# Patient Record
Sex: Female | Born: 1960 | Race: White | Hispanic: No | Marital: Single | Smoking: Current every day smoker
Health system: Southern US, Community
[De-identification: ages and names within clinical notes are randomized; demographics above are authoritative.]

## PROBLEM LIST (undated history)

## (undated) DIAGNOSIS — N289 Disorder of kidney and ureter, unspecified: Secondary | ICD-10-CM

## (undated) DIAGNOSIS — R011 Cardiac murmur, unspecified: Secondary | ICD-10-CM

## (undated) HISTORY — PX: JOINT REPLACEMENT: SHX530

---

## 2016-10-15 ENCOUNTER — Emergency Department (HOSPITAL_COMMUNITY)
Admission: EM | Admit: 2016-10-15 | Discharge: 2016-10-15 | Disposition: A | Discharge status: Home / Self Care | Payer: Medicaid - Out of State | Attending: Emergency Medicine | Admitting: Emergency Medicine

## 2016-10-15 ENCOUNTER — Emergency Department (HOSPITAL_COMMUNITY): Payer: Medicaid - Out of State

## 2016-10-15 ENCOUNTER — Encounter (HOSPITAL_COMMUNITY): Payer: Self-pay | Admitting: Emergency Medicine

## 2016-10-15 DIAGNOSIS — R112 Nausea with vomiting, unspecified: Secondary | ICD-10-CM | POA: Diagnosis not present

## 2016-10-15 DIAGNOSIS — R1013 Epigastric pain: Secondary | ICD-10-CM

## 2016-10-15 DIAGNOSIS — F17210 Nicotine dependence, cigarettes, uncomplicated: Secondary | ICD-10-CM | POA: Insufficient documentation

## 2016-10-15 DIAGNOSIS — K5900 Constipation, unspecified: Secondary | ICD-10-CM | POA: Diagnosis not present

## 2016-10-15 HISTORY — DX: Disorder of kidney and ureter, unspecified: N28.9

## 2016-10-15 LAB — URINALYSIS, ROUTINE W REFLEX MICROSCOPIC
Bacteria, UA: NONE SEEN
Bilirubin Urine: NEGATIVE
GLUCOSE, UA: NEGATIVE mg/dL
HGB URINE DIPSTICK: NEGATIVE
Ketones, ur: 20 mg/dL — AB
LEUKOCYTES UA: NEGATIVE
NITRITE: NEGATIVE
PH: 9 — AB (ref 5.0–8.0)
PROTEIN: 30 mg/dL — AB
SPECIFIC GRAVITY, URINE: 1.017 (ref 1.005–1.030)
WBC, UA: NONE SEEN WBC/hpf (ref 0–5)

## 2016-10-15 LAB — COMPREHENSIVE METABOLIC PANEL
ALBUMIN: 4.5 g/dL (ref 3.5–5.0)
ALK PHOS: 100 U/L (ref 38–126)
ALT: 21 U/L (ref 14–54)
AST: 20 U/L (ref 15–41)
Anion gap: 6 (ref 5–15)
BILIRUBIN TOTAL: 0.6 mg/dL (ref 0.3–1.2)
BUN: 16 mg/dL (ref 6–20)
CALCIUM: 10.6 mg/dL — AB (ref 8.9–10.3)
CO2: 27 mmol/L (ref 22–32)
Chloride: 105 mmol/L (ref 101–111)
Creatinine, Ser: 0.5 mg/dL (ref 0.44–1.00)
GFR calc Af Amer: >60 mL/min (ref >60)
GLUCOSE: 119 mg/dL — AB (ref 65–99)
POTASSIUM: 3.6 mmol/L (ref 3.5–5.1)
Sodium: 138 mmol/L (ref 135–145)
TOTAL PROTEIN: 7.1 g/dL (ref 6.5–8.1)

## 2016-10-15 LAB — CBC
HCT: 41.3 % (ref 36.0–46.0)
Hemoglobin: 14.1 g/dL (ref 12.0–15.0)
MCH: 27.4 pg (ref 26.0–34.0)
MCHC: 34.1 g/dL (ref 30.0–36.0)
MCV: 80.2 fL (ref 78.0–100.0)
PLATELETS: 289 10*3/uL (ref 150–400)
RBC: 5.15 MIL/uL — ABNORMAL HIGH (ref 3.87–5.11)
RDW: 13.6 % (ref 11.5–15.5)
WBC: 7.6 10*3/uL (ref 4.0–10.5)

## 2016-10-15 LAB — LIPASE, BLOOD: Lipase: 13 U/L (ref 11–51)

## 2016-10-15 MED ORDER — IOPAMIDOL (ISOVUE-300) INJECTION 61%
INTRAVENOUS | Status: AC
  Administered 2016-10-15: 100 mL via INTRAVENOUS
  Filled 2016-10-15: qty 100

## 2016-10-15 MED ORDER — FENTANYL CITRATE (PF) 100 MCG/2ML IJ SOLN
25.0000 ug | Freq: Once | INTRAMUSCULAR | Status: AC
  Administered 2016-10-15: 25 ug via INTRAVENOUS
  Filled 2016-10-15: qty 2

## 2016-10-15 MED ORDER — MORPHINE SULFATE (PF) 4 MG/ML IV SOLN
4.0000 mg | Freq: Once | INTRAVENOUS | Status: AC
  Administered 2016-10-15: 4 mg via INTRAVENOUS
  Filled 2016-10-15: qty 1

## 2016-10-15 MED ORDER — ONDANSETRON 8 MG PO TBDP: 8.0000 mg | ORAL_TABLET | Freq: Three times a day (TID) | ORAL | 0 refills | Status: AC | PRN

## 2016-10-15 MED ORDER — OMEPRAZOLE 20 MG PO CPDR
20.0000 mg | DELAYED_RELEASE_CAPSULE | Freq: Every day | ORAL | 0 refills | Status: AC
Start: 2016-10-15 — End: ?

## 2016-10-15 MED ORDER — SODIUM CHLORIDE 0.9 % IV BOLUS (SEPSIS)
1000.0000 mL | Freq: Once | INTRAVENOUS | Status: AC
  Administered 2016-10-15: 1000 mL via INTRAVENOUS

## 2016-10-15 MED ORDER — GI COCKTAIL ~~LOC~~
30.0000 mL | Freq: Once | ORAL | Status: AC
  Administered 2016-10-15: 30 mL via ORAL
  Filled 2016-10-15: qty 30

## 2016-10-15 MED ORDER — DICYCLOMINE HCL 20 MG PO TABS: 20.0000 mg | ORAL_TABLET | Freq: Two times a day (BID) | ORAL | 0 refills | Status: AC

## 2016-10-15 MED ORDER — MORPHINE SULFATE (PF) 2 MG/ML IV SOLN: 2.0000 mg | Freq: Once | INTRAVENOUS | Status: DC

## 2016-10-15 MED ORDER — METOCLOPRAMIDE HCL 5 MG/ML IJ SOLN
10.0000 mg | Freq: Once | INTRAMUSCULAR | Status: AC
  Administered 2016-10-15: 10 mg via INTRAVENOUS
  Filled 2016-10-15: qty 2

## 2016-10-15 NOTE — ED Provider Notes (Signed)
[facility] Provider Note   CSN: [id number] Arrival date & time: 10/15/16  1400     History   Chief Complaint Chief Complaint  Patient presents with  . Abdominal Pain    HPI Laura Stevenson is a 56 y.o. female.  HPI 56 year old Hispanic female with no significant past medical history presents to the emergency department today with complaints of epigastric abdominal pain. Patient states that she woke up this morning with severe epigastric abdominal pain. States the pain radiates to her left kidney. Had history of same in the past that was likely due to gastroenteritis. States that she does have a history of a cyst on her left kidney that she has not followed up with. She reports associated nausea and one episode of nonbloody nonbilious emesis. States that she did have a bowel movement this morning that was hard in nature. Reports constipation. States that her pain became worse after drinking water this morning. She has not tried nothing for her symptoms at home. Nothing makes better or worse. Rates the pain as sharp in nature and intermittent comes in waves. Rates her pain a 10 out of 10.  Pt denies any fever, chill, ha, vision changes, lightheadedness, dizziness, congestion, neck pain, cp, sob, cough, diarrhea, urinary symptoms, change in bowel habits, melena, hematochezia, lower extremity paresthesias.  Past Medical History:  Diagnosis Date  . Renal disorder     There are no active problems to display for this patient.   History reviewed. No pertinent surgical history.  OB History    No data available       Home Medications    Prior to Admission medications   Not on File    Family History No family history on file.  Social History Social History  Substance Use Topics  . Smoking status: Current Every Day Smoker    Packs/day: 0.50    Types: Cigarettes  . Smokeless tobacco: Not on file  . Alcohol use No     Allergies   Patient has no known  allergies.   Review of Systems Review of Systems  Constitutional: Negative for chills and fever.  HENT: Negative for congestion.   Eyes: Negative for visual disturbance.  Respiratory: Negative for cough and shortness of breath.   Cardiovascular: Negative for chest pain.  Gastrointestinal: Positive for abdominal pain, constipation, nausea and vomiting. Negative for blood in stool and diarrhea.  Genitourinary: Negative for dysuria, flank pain, frequency, hematuria, urgency, vaginal bleeding and vaginal discharge.  Musculoskeletal: Negative for arthralgias and myalgias.  Skin: Negative for rash.  Neurological: Negative for dizziness, syncope, weakness, light-headedness, numbness and headaches.  Psychiatric/Behavioral: Negative for sleep disturbance. The patient is not nervous/anxious.      Physical Exam Updated Vital Signs BP 107/67 (BP Location: Left Arm)   Pulse 71   Temp 98.5 F (36.9 C) (Oral)   Resp 14   SpO2 100%   Physical Exam  Constitutional: She is oriented to person, place, and time. She appears well-developed and well-nourished.  Non-toxic appearance. No distress.  HENT:  Head: Normocephalic and atraumatic.  Nose: Nose normal.  Mouth/Throat: Oropharynx is clear and moist.  Eyes: Pupils are equal, round, and reactive to light. Conjunctivae are normal. Right eye exhibits no discharge. Left eye exhibits no discharge.  Neck: Normal range of motion. Neck supple.  Cardiovascular: Normal rate, regular rhythm, normal heart sounds and intact distal pulses.   Pulmonary/Chest: Effort normal and breath sounds normal. No respiratory distress. She exhibits no tenderness.  Abdominal: Soft.  Bowel sounds are normal. She exhibits no pulsatile midline mass. There is tenderness in the epigastric area. There is no rigidity, no rebound, no guarding, no CVA tenderness, no tenderness at McBurney's point and negative Murphy's sign.  Musculoskeletal: Normal range of motion. She exhibits no  tenderness.  Lymphadenopathy:    She has no cervical adenopathy.  Neurological: She is alert and oriented to person, place, and time.  Skin: Skin is warm and dry. Capillary refill takes less than 2 seconds.  Psychiatric: Her behavior is normal. Judgment and thought content normal.  Nursing note and vitals reviewed.    ED Treatments / Results  Labs (all labs ordered are listed, but only abnormal results are displayed) Labs Reviewed  COMPREHENSIVE METABOLIC PANEL - Abnormal; Notable for the following:       Result Value   Glucose, Bld 119 (*)    Calcium 10.6 (*)    All other components within normal limits  CBC - Abnormal; Notable for the following:    RBC 5.15 (*)    All other components within normal limits  URINALYSIS, ROUTINE W REFLEX MICROSCOPIC - Abnormal; Notable for the following:    APPearance TURBID (*)    pH 9.0 (*)    Ketones, ur 20 (*)    Protein, ur 30 (*)    Squamous Epithelial / LPF 0-5 (*)    All other components within normal limits  LIPASE, BLOOD    EKG  EKG Interpretation None       Radiology Ct Abdomen Pelvis W Contrast  Result Date: 10/15/2016 CLINICAL DATA:  Onset of severe epigastric pain today. The patient reports recent constipation. EXAM: CT ABDOMEN AND PELVIS WITH CONTRAST TECHNIQUE: Multidetector CT imaging of the abdomen and pelvis was performed using the standard protocol following bolus administration of intravenous contrast. CONTRAST:  100 CC ISOVUE-300. COMPARISON:  Plain films the chest and abdomen this same day. FINDINGS: Lower chest: Lung bases are clear. No pleural or pericardial effusion. Heart size is normal. Hepatobiliary: No focal liver abnormality is seen. No gallstones, gallbladder wall thickening, or biliary dilatation. Pancreas: Unremarkable. No pancreatic ductal dilatation or surrounding inflammatory changes. Spleen: Normal in size without focal abnormality. Adrenals/Urinary Tract: There is severe left hydronephrosis. Cause is not  identified. Small bilateral renal cysts are seen. The kidneys are otherwise unremarkable. The ureters and urinary bladder appear normal. The adrenal glands appear normal. Stomach/Bowel: Stomach is within normal limits. Appendix appears normal. No evidence of bowel wall thickening, distention, or inflammatory changes. Moderate stool burden ascending colon noted. Vascular/Lymphatic: No significant vascular findings are present. No enlarged abdominal or pelvic lymph nodes. Reproductive: Uterus and bilateral adnexa are unremarkable. Other: No abdominal wall hernia or abnormality. No abdominopelvic ascites. Musculoskeletal: No lytic or sclerotic lesion. Convex lumbar scoliosis is noted. Degenerative disease L5-S1 is noted. 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 No acute abnormality involving the stomach or small large bowel. Moderate stool burden ascending colon noted. Convex left scoliosis and L5-S1 degenerative disc disease. Electronically Signed   By: Drusilla Kanner M.D.   On: 10/15/2016 17:16   Dg Abdomen Acute W/chest  Result Date: 10/15/2016 CLINICAL DATA:  Epigastric abdominal pain EXAM: DG ABDOMEN ACUTE W/ 1V CHEST COMPARISON:  None. FINDINGS: Lungs are clear.  No pleural effusion or pneumothorax. The heart is normal in size. Nondilated but mildly irregular loops of small bowel in the left mid abdomen, with mild thickening of the mucosal folds. This appearance is  not suggest small bowel obstruction but could reflect small bowel enteritis. Moderate colonic stool burden in the right mid abdomen. No evidence of free air under the diaphragm on the upright view. Mild lumbar levoscoliosis. IMPRESSION: No evidence of acute cardiopulmonary disease. No evidence of small bowel obstruction. Possible small bowel enteritis. Moderate colonic stool burden in the right mid abdomen. No free air. Electronically Signed   By: Charline Bills M.D.    On: 10/15/2016 16:20    Procedures Procedures (including critical care time)  Medications Ordered in ED Medications  morphine 4 MG/ML injection 4 mg (4 mg Intravenous Given 10/15/16 1516)  metoCLOPramide (REGLAN) injection 10 mg (10 mg Intravenous Given 10/15/16 1516)  iopamidol (ISOVUE-300) 61 % injection (100 mLs Intravenous Contrast Given 10/15/16 1648)  sodium chloride 0.9 % bolus 1,000 mL (0 mLs Intravenous Stopped 10/15/16 1854)  gi cocktail (Maalox,Lidocaine,Donnatal) (30 mLs Oral Given 10/15/16 1853)  fentaNYL (SUBLIMAZE) injection 25 mcg (25 mcg Intravenous Given 10/15/16 1853)     Initial Impression / Assessment and Plan / ED Course  I have reviewed the triage vital signs and the nursing notes.  Pertinent labs & imaging results that were available during my care of the patient were reviewed by me and considered in my medical decision making (see chart for details).     Patient presents to the ED with complaints of epigastric abdominal pain and nausea and emesis. Denies any associated symptoms of fever, blood in her stool, diarrhea. Patient does report constipation. On exam patient has no signs of peritonitis. She does have some epigastric tenderness to palpation. No right upper quadrant abdominal pain. Labs are reassuring. Lipase is normal. Doubt pancreatitis. No leukocytosis. All other labs unremarkable. UA does not show any signs of infection but does show signs of dehydration. Patient given 2 fluid boluses in the ED. No other signs of infectious cause at this time. X-ray of abdomen was obtained to rule out any signs of free air. X-ray shows no obstruction, no free air. Initial signs of possible enteritis. CT scan was obtained that showed no acute findings. She does have hydronephrosis of the left kidney which is chronic in nature. Patient with history of same. To speak with Dr. Vernie Ammons with urology. He states the patient can follow-up in the outpatient setting for findings.  Patient  was reassessed. Pain has improved. Repeat abdominal exam shows no signs of peritonitis. She is able tolerate by mouth fluids and a difficulties. Patient's symptoms likely due to a viral enteritis. We'll treat symptomatically at home with close follow-up with PCP and urology. Discussed with Dr. Erma Heritage who is agreeable to the above plan.  Pt is hemodynamically stable, in NAD, & able to ambulate in the ED. Evaluation does not show pathology that would require ongoing emergent intervention or inpatient treatment. I explained the diagnosis to the patient. Pain has been managed & has no complaints prior to dc. Pt is comfortable with above plan and is stable for discharge at this time. All questions were answered prior to disposition. Strict return precautions for f/u to the ED were discussed. Encouraged follow up with PCP.   Final Clinical Impressions(s) / ED Diagnoses   Final diagnoses:  Epigastric abdominal pain    New Prescriptions New Prescriptions   DICYCLOMINE (BENTYL) 20 MG TABLET    Take 1 tablet (20 mg total) by mouth 2 (two) times daily.   OMEPRAZOLE (PRILOSEC) 20 MG CAPSULE    Take 1 capsule (20 mg total) by mouth daily.  ONDANSETRON (ZOFRAN-ODT) 8 MG DISINTEGRATING TABLET    Take 1 tablet (8 mg total) by mouth every 8 (eight) hours as needed for nausea.     Rise Mu, PA-C 10/15/16 Evelena Asa, MD 10/17/16 1136

## 2016-10-15 NOTE — Discharge Instructions (Signed)
Your abdominal pain is likely from gastritis, reflux or a stomach ulcer. You will need to take the prescribed proton pump inhibitor as directed, and avoid spicy/fatty/acidic foods. Avoid laying down flat within 30 minutes of eating. Avoid NSAIDs like ibuprofen or Aleve on an empty stomach. Use zofran as needed for nausea. May take the bentyl for stomach cramps. Follow up with the gastroenterologist (GI doctor) listed for ongoing evaluation of your abdominal pain. Return to the ER for new or worsening symptoms, any additional concers.  You also need to follow up with a urologist concerning your kidney findings.    SEEK IMMEDIATE MEDICAL ATTENTION IF YOU DEVELOP ANY OF THE FOLLOWING SYMPTOMS: The pain does not go away or becomes severe.  A temperature above 101 develops.  Repeated vomiting occurs (multiple episodes).  Blood is being passed in stools or vomit (bright red or black tarry stools).  Return also if you develop chest pain, difficulty breathing, dizziness or fainting

## 2016-10-15 NOTE — ED Triage Notes (Signed)
Pt c/o severe epigastric abdominal pain onset today, not related to exertion. Has been constipated, had inadequate bowel movement today. Became worse after drinking water, unsure if related.

## 2019-01-02 IMAGING — CT CT ABD-PELV W/ CM
2 of 5 series · 16 of 46 positions shown, 18 images · IV contrast (agent unspecified)
Comparison: Plain films the chest and abdomen this same day.

CLINICAL DATA: Onset of severe epigastric pain today. The patient
reports recent constipation.

EXAM:
CT ABDOMEN AND PELVIS WITH CONTRAST
TECHNIQUE: Multidetector CT imaging of the abdomen and pelvis was performed
using the standard protocol following bolus administration of
intravenous contrast.
CONTRAST:  100 CC [id number].

[Series 2: abd/pel with · axial · 0.77mm/px · z∈[+1272,+1648]mm · 13 of 89 slices shown, 15 images]
[im 7/89  soft-tissue]
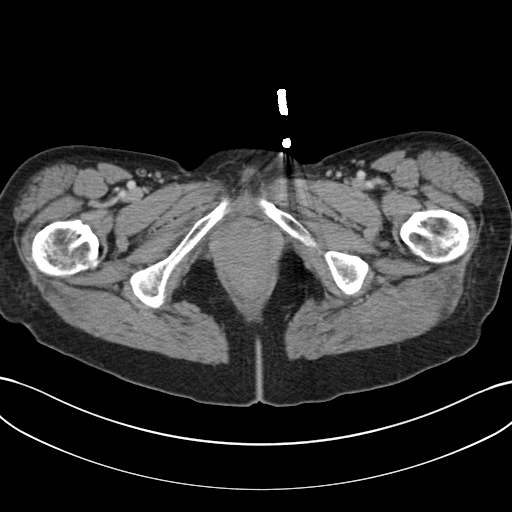
[im 7/89  bone]
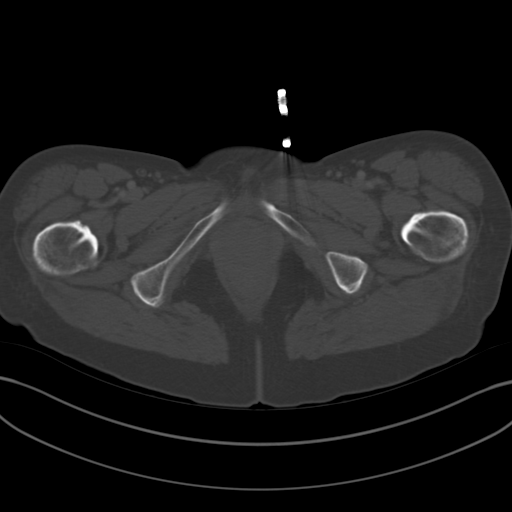
[im 13/89  soft-tissue]
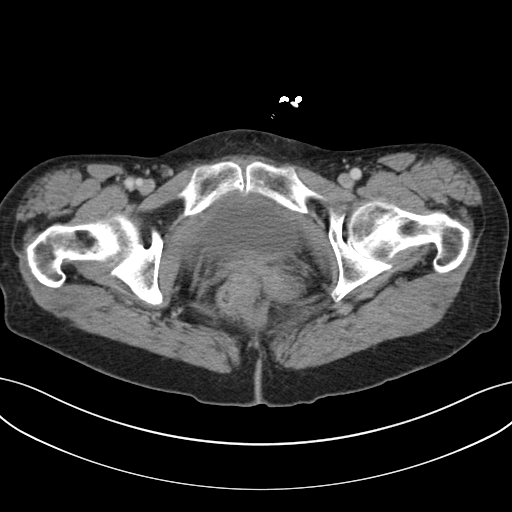
[im 19/89  soft-tissue]
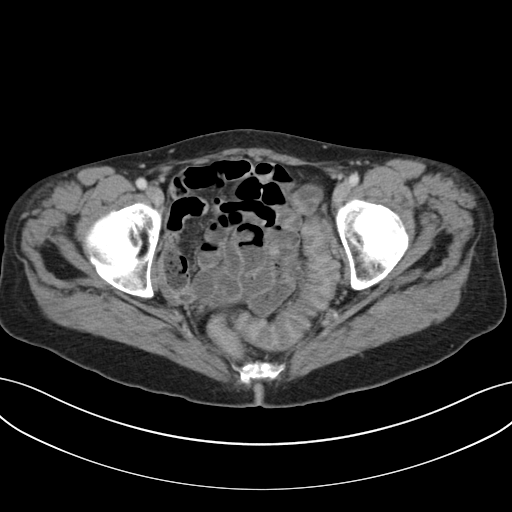
[im 26/89  soft-tissue]
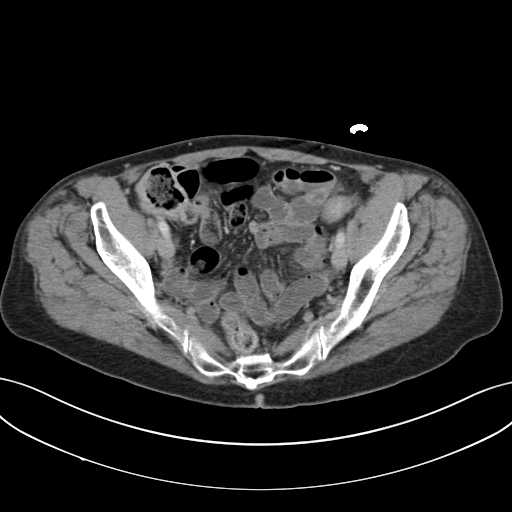
[im 32/89  soft-tissue]
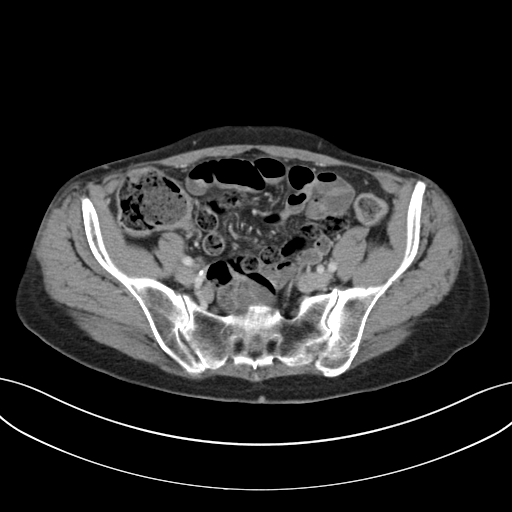
[im 38/89  soft-tissue]
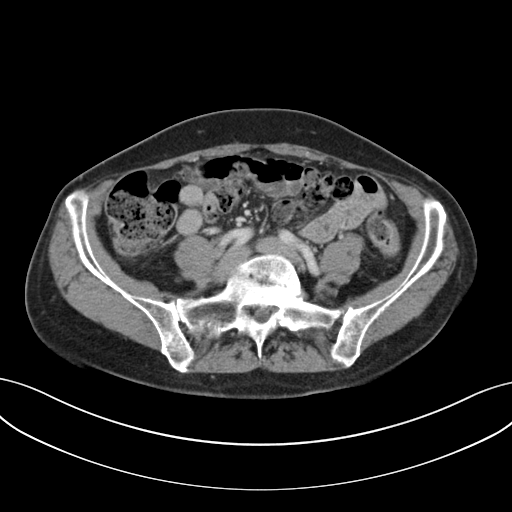
[im 45/89  soft-tissue]
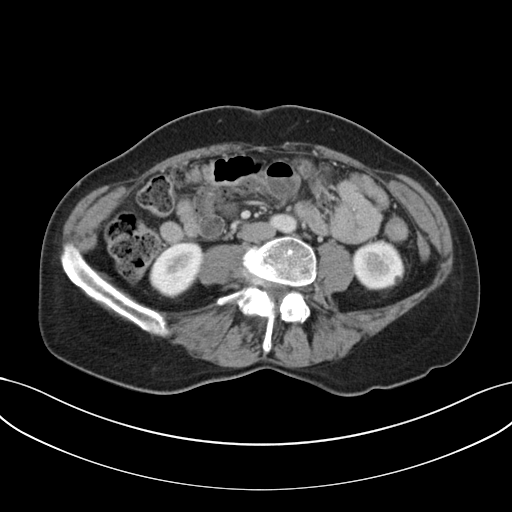
[im 51/89  soft-tissue]
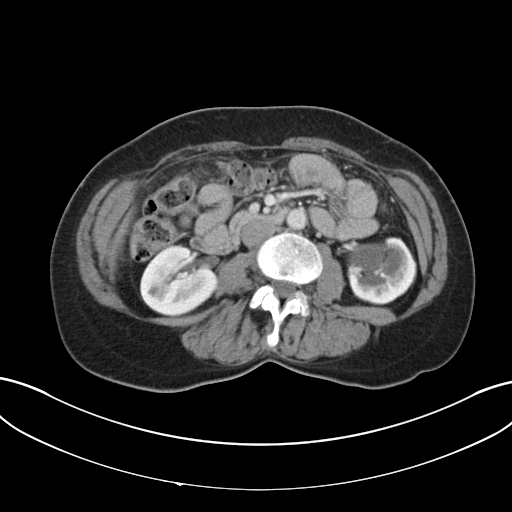
[im 57/89  soft-tissue]
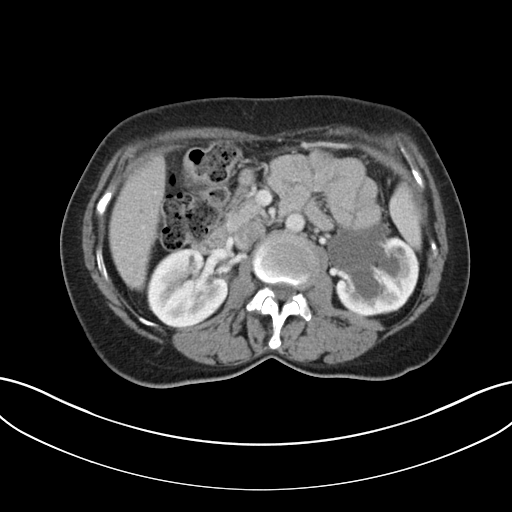
[im 57/89  bone]
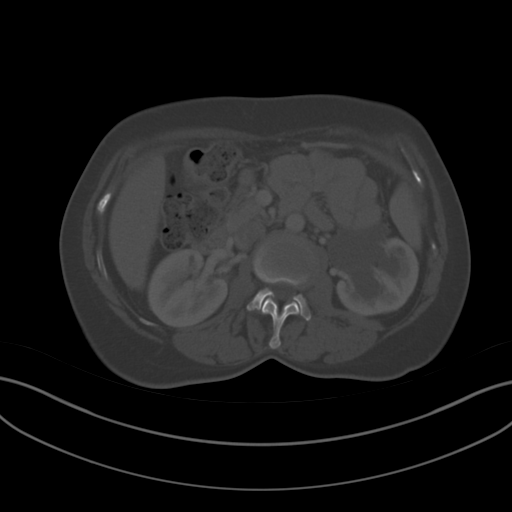
[im 63/89  soft-tissue]
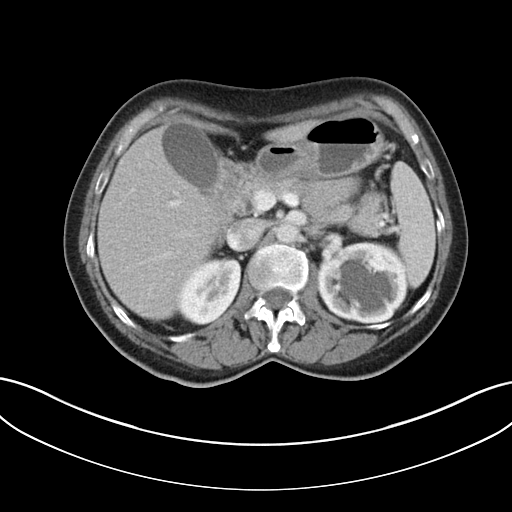
[im 70/89  soft-tissue]
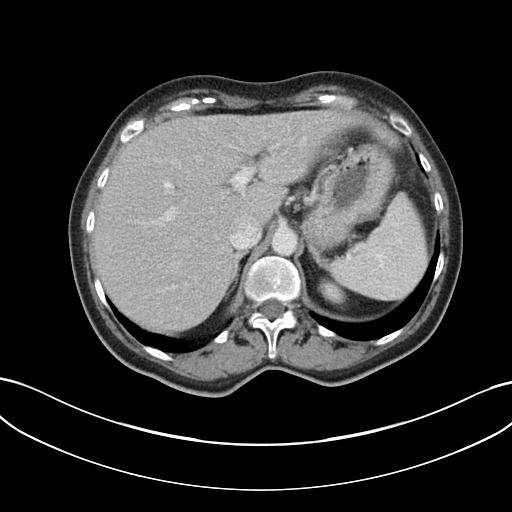
[im 76/89  soft-tissue]
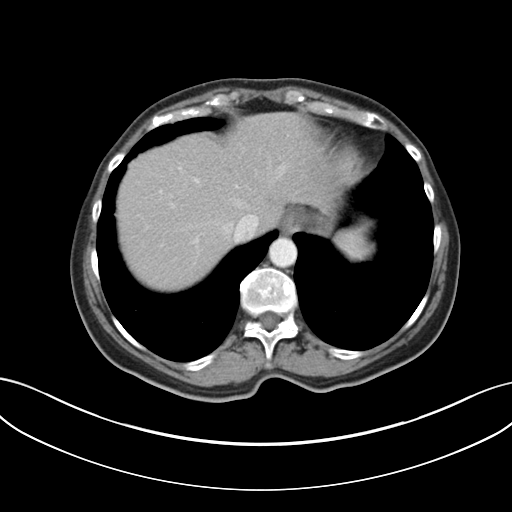
[im 82/89  soft-tissue]
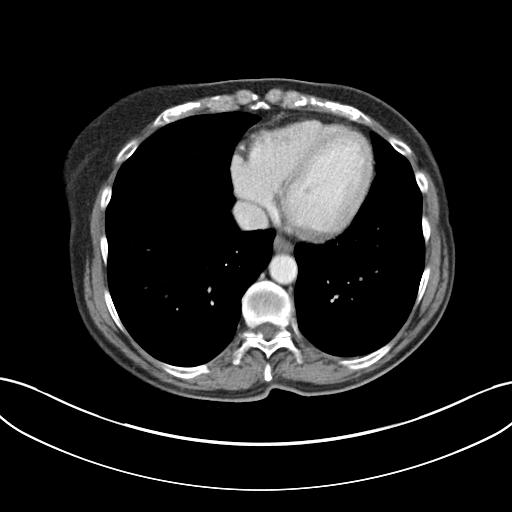

[Series 4: coronal a/|p · coronal · 0.74mm/px · 3 of 138 slices shown]
[im 46/138  soft-tissue]
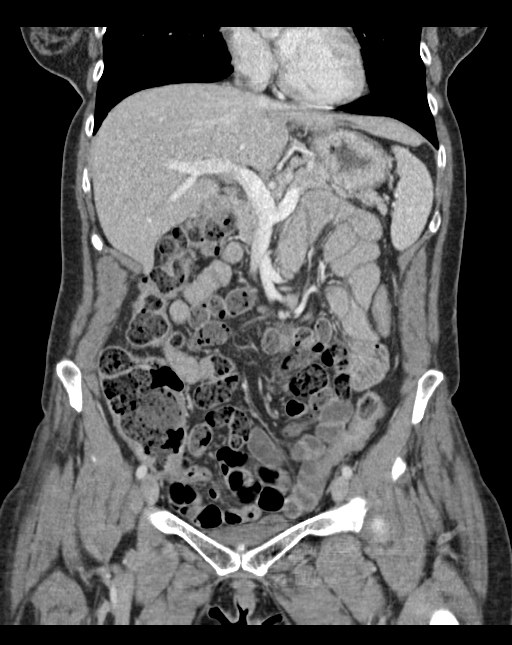
[im 61/138  soft-tissue]
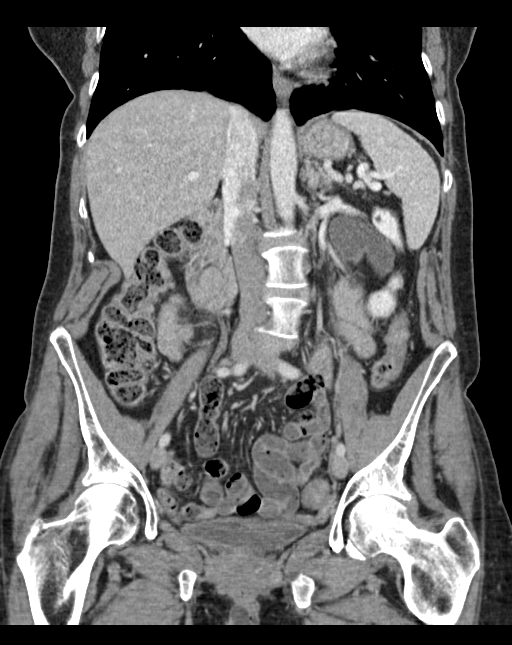
[im 77/138  soft-tissue]
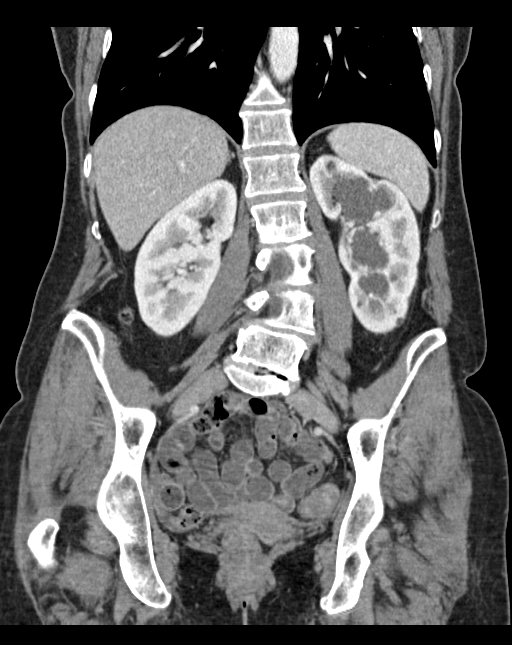

[16 of 46 positions shown; findings below may reference images not displayed]

FINDINGS: Lower chest: Lung bases are clear. No pleural or pericardial
effusion. Heart size is normal.

Hepatobiliary: No focal liver abnormality is seen. No gallstones,
gallbladder wall thickening, or biliary dilatation.

Pancreas: Unremarkable. No pancreatic ductal dilatation or
surrounding inflammatory changes.

Spleen: Normal in size without focal abnormality.

Adrenals/Urinary Tract: There is severe left hydronephrosis. Cause
is not identified. Small bilateral renal cysts are seen. The kidneys
are otherwise unremarkable. The ureters and urinary bladder appear
normal. The adrenal glands appear normal.

Stomach/Bowel: Stomach is within normal limits. Appendix appears
normal. No evidence of bowel wall thickening, distention, or
inflammatory changes. Moderate stool burden ascending colon noted.

Vascular/Lymphatic: No significant vascular findings are present. No
enlarged abdominal or pelvic lymph nodes.

Reproductive: Uterus and bilateral adnexa are unremarkable.

Other: No abdominal wall hernia or abnormality. No abdominopelvic
ascites.

Musculoskeletal: No lytic or sclerotic lesion. Convex lumbar
scoliosis is noted. Degenerative disease L5-S1 is noted.
IMPRESSION: Severe left hydronephrosis without obstructing lesion seen is likely
due to UPJ stricture and chronic.

No acute abnormality involving the stomach or small large bowel.
Moderate stool burden ascending colon noted.

Convex left scoliosis and L5-S1 degenerative disc disease.

## 2020-05-24 ENCOUNTER — Other Ambulatory Visit: Payer: Self-pay

## 2020-05-24 ENCOUNTER — Ambulatory Visit (HOSPITAL_COMMUNITY)
Admission: EM | Admit: 2020-05-24 | Discharge: 2020-05-24 | Disposition: A | Discharge status: Home / Self Care | Payer: Medicaid - Out of State | Attending: Medical Oncology | Admitting: Medical Oncology

## 2020-05-24 ENCOUNTER — Encounter (HOSPITAL_COMMUNITY): Payer: Self-pay | Admitting: Emergency Medicine

## 2020-05-24 DIAGNOSIS — S01511A Laceration without foreign body of lip, initial encounter: Secondary | ICD-10-CM

## 2020-05-24 MED ORDER — LIDOCAINE VISCOUS HCL 2 % MT SOLN
15.0000 mL | OROMUCOSAL | 0 refills | Status: AC | PRN
Start: 2020-05-24 — End: ?

## 2020-05-24 NOTE — ED Triage Notes (Signed)
Last night, patient was mopping the floor,  She was ringing out the mop in the bucket, the roller broke, allowing her to fall further forward than expected hit chin on a counter top.  Patient has injury to inside of lower lip where tooth impacted inside of mouth.  Denies broken teeth.  Tooth was painful.  Has used Anbesol.  Slight marking to outside of mouth, just below lip, not sire if from injury in mouth.  Edge of chin is slightly sore, no visible bruise

## 2020-05-24 NOTE — ED Provider Notes (Signed)
[facility]    CSN: [id number] Arrival date & time: 05/24/20  1405      History   Chief Complaint Chief Complaint  Patient presents with  . Mouth Injury    HPI Laura Stevenson is a 60 y.o. female.   HPI   Gum injury: Patient states that last night around 7 PM she was mopping her floors when she accidentally slipped forward and hit her chin and lip area on the mop bucket.  Immediately had 10-10 pain and discomfort.  She denies any significant blood loss.  She states that she has a gash of the inside of her lip where her lip hit her tooth.  Initially she had some dental pain but this has resolved. She has used Anbesol and Tylenol which is helped her pain.  She states that she was not going to come for evaluation but her sister was concerned that she may need sutures.  She denies any recent bleeding, trouble breathing, trouble swallowing. No head injury or LOC.   Past Medical History:  Diagnosis Date  . Renal disorder     There are no problems to display for this patient.   History reviewed. No pertinent surgical history.  OB History   No obstetric history on file.      Home Medications    Prior to Admission medications   Medication Sig Start Date End Date Taking? Authorizing Provider  albuterol (ACCUNEB) 1.25 MG/3ML nebulizer solution Take 1 ampule by nebulization every 6 (six) hours as needed for wheezing or shortness of breath.  09/09/16   [provider]  dicyclomine (BENTYL) 20 MG tablet Take 1 tablet (20 mg total) by mouth 2 (two) times daily. 10/15/16   Rise Mu, PA-C  Magnesium 500 MG CAPS Take 1 capsule by mouth daily.    [provider]  omeprazole (PRILOSEC) 20 MG capsule Take 1 capsule (20 mg total) by mouth daily. 10/15/16   Demetrios Loll T, PA-C  ondansetron (ZOFRAN-ODT) 8 MG disintegrating tablet Take 1 tablet (8 mg total) by mouth every 8 (eight) hours as needed for nausea. 10/15/16   Leaphart, Lynann Beaver, PA-C  VENTOLIN  HFA 108 (90 Base) MCG/ACT inhaler Inhale 2 puffs into the lungs every 6 (six) hours as needed for wheezing or shortness of breath.  09/09/16   [provider]  vitamin E 1000 UNIT capsule Take 1,000 Units by mouth daily.    [provider]    Family History Family History  Problem Relation Age of Onset  . Hypertension Mother   . Diabetes Mother     Social History Social History   Tobacco Use  . Smoking status: Current Every Day Smoker    Packs/day: 0.50    Types: Cigarettes  Substance Use Topics  . Alcohol use: No  . Drug use: No     Allergies   Patient has no known allergies.   Review of Systems Review of Systems  As stated above in HPI Physical Exam Triage Vital Signs ED Triage Vitals  Enc Vitals Group     BP 05/24/20 1432 (!) 133/58     Pulse Rate 05/24/20 1432 76     Resp 05/24/20 1432 20     Temp 05/24/20 1432 98.7 F (37.1 C)     Temp Source 05/24/20 1432 Oral     SpO2 05/24/20 1432 98 %     Weight --      Height --      Head Circumference --  Peak Flow --      Pain Score 05/24/20 1429 4     Pain Loc --      Pain Edu? --      Excl. in GC? --    No data found.  Updated Vital Signs BP (!) 133/58 (BP Location: Right Arm)   Pulse 76   Temp 98.7 F (37.1 C) (Oral)   Resp 20   SpO2 98%   Physical Exam Vitals and nursing note reviewed.  Constitutional:      General: She is not in acute distress.    Appearance: Normal appearance. She is not ill-appearing, toxic-appearing or diaphoretic.  HENT:     Head: Atraumatic.     Nose: Nose normal.     Mouth/Throat:     Mouth: Mucous membranes are moist. Injury, lacerations and oral lesions present. No angioedema.     Dentition: Dental caries present.     Tongue: No lesions. Tongue does not deviate from midline.     Palate: No mass and lesions.     Pharynx: Oropharynx is clear. Uvula midline. No pharyngeal swelling, oropharyngeal exudate, posterior oropharyngeal erythema or uvula  swelling.      Comments: NO tenderness to palpation of teeth and no evidence of broken teeth  Musculoskeletal:     Cervical back: Normal range of motion and neck supple.  Neurological:     Mental Status: She is alert.      UC Treatments / Results  Labs (all labs ordered are listed, but only abnormal results are displayed) Labs Reviewed - No data to display  EKG   Radiology No results found.  Procedures Procedures (including critical care time)  Medications Ordered in UC Medications - No data to display  Initial Impression / Assessment and Plan / UC Course  I have reviewed the triage vital signs and the nursing notes.  Pertinent labs & imaging results that were available during my care of the patient were reviewed by me and considered in my medical decision making (see chart for details).     New.  I discussed with patient that due to the timing and physical exam findings I would not recommend that we suture her lip today as it appears to be healing well on its own and it is well past 12 hours past injury.  Instead I have recommended that she avoid acidic and spicy foods and that this should heal well on its own.  We did discuss that trauma to the dental bed can lead to teeth infection later on in life and that she should stay up-to-date with a dentist.  We also discussed red flag signs and symptoms that would warrant further work-up.  She can continue using Tylenol or ibuprofen as needed.  Final Clinical Impressions(s) / UC Diagnoses   Final diagnoses:  None   Discharge Instructions   None    ED Prescriptions    None     PDMP not reviewed this encounter.   Rushie Chestnut, [place] 05/24/20 1515

## 2023-04-04 ENCOUNTER — Emergency Department (HOSPITAL_BASED_OUTPATIENT_CLINIC_OR_DEPARTMENT_OTHER)
Admission: EM | Admit: 2023-04-04 | Discharge: 2023-04-04 | Disposition: A | Discharge status: Home / Self Care | Payer: MEDICAID | Attending: Emergency Medicine | Admitting: Emergency Medicine

## 2023-04-04 ENCOUNTER — Encounter (HOSPITAL_BASED_OUTPATIENT_CLINIC_OR_DEPARTMENT_OTHER): Payer: Self-pay | Admitting: Emergency Medicine

## 2023-04-04 ENCOUNTER — Emergency Department (HOSPITAL_BASED_OUTPATIENT_CLINIC_OR_DEPARTMENT_OTHER): Payer: MEDICAID

## 2023-04-04 ENCOUNTER — Other Ambulatory Visit (HOSPITAL_BASED_OUTPATIENT_CLINIC_OR_DEPARTMENT_OTHER): Payer: Self-pay

## 2023-04-04 ENCOUNTER — Other Ambulatory Visit: Payer: Self-pay

## 2023-04-04 DIAGNOSIS — R310 Gross hematuria: Secondary | ICD-10-CM | POA: Diagnosis not present

## 2023-04-04 DIAGNOSIS — R319 Hematuria, unspecified: Secondary | ICD-10-CM | POA: Diagnosis present

## 2023-04-04 HISTORY — DX: Cardiac murmur, unspecified: R01.1

## 2023-04-04 LAB — URINALYSIS, ROUTINE W REFLEX MICROSCOPIC
Bilirubin Urine: NEGATIVE
Glucose, UA: NEGATIVE mg/dL
Ketones, ur: NEGATIVE mg/dL
Leukocytes,Ua: NEGATIVE
Nitrite: NEGATIVE
RBC / HPF: >50 RBC/hpf (ref 0–5)
Specific Gravity, Urine: 1.013 (ref 1.005–1.030)
pH: 6.5 (ref 5.0–8.0)

## 2023-04-04 LAB — COMPREHENSIVE METABOLIC PANEL
ALT: 22 U/L (ref 0–44)
AST: 17 U/L (ref 15–41)
Albumin: 4.3 g/dL (ref 3.5–5.0)
Alkaline Phosphatase: 137 U/L — ABNORMAL HIGH (ref 38–126)
Anion gap: 7 (ref 5–15)
BUN: 14 mg/dL (ref 8–23)
CO2: 28 mmol/L (ref 22–32)
Calcium: 10.7 mg/dL — ABNORMAL HIGH (ref 8.9–10.3)
Chloride: 105 mmol/L (ref 98–111)
Creatinine, Ser: 0.48 mg/dL (ref 0.44–1.00)
GFR, Estimated: >60 mL/min (ref >60)
Glucose, Bld: 91 mg/dL (ref 70–99)
Potassium: 3.6 mmol/L (ref 3.5–5.1)
Sodium: 140 mmol/L (ref 135–145)
Total Bilirubin: 0.5 mg/dL (ref 0.0–1.2)
Total Protein: 6.9 g/dL (ref 6.5–8.1)

## 2023-04-04 LAB — CBC
HCT: 47.1 % — ABNORMAL HIGH (ref 36.0–46.0)
Hemoglobin: 15.2 g/dL — ABNORMAL HIGH (ref 12.0–15.0)
MCH: 27.4 pg (ref 26.0–34.0)
MCHC: 32.3 g/dL (ref 30.0–36.0)
MCV: 84.9 fL (ref 80.0–100.0)
Platelets: 281 10*3/uL (ref 150–400)
RBC: 5.55 MIL/uL — ABNORMAL HIGH (ref 3.87–5.11)
RDW: 13.2 % (ref 11.5–15.5)
WBC: 5.8 10*3/uL (ref 4.0–10.5)
nRBC: 0 % (ref 0.0–0.2)

## 2023-04-04 LAB — LIPASE, BLOOD: Lipase: 10 U/L — ABNORMAL LOW (ref 11–51)

## 2023-04-04 MED ORDER — CEPHALEXIN 500 MG PO CAPS
1000.0000 mg | ORAL_CAPSULE | Freq: Two times a day (BID) | ORAL | 0 refills | Status: AC
  Filled 2023-04-04: qty 28, 7d supply, fill #0

## 2023-04-04 NOTE — ED Provider Notes (Signed)
[place] EMERGENCY DEPARTMENT AT [facility] Provider Note   CSN: [id number] Arrival date & time: 04/04/23  4098     History  Chief Complaint  Patient presents with   Abdominal Pain   Hematuria    Laura Stevenson is a 62 y.o. female.  62 yo F with a chief complaints of suprapubic abdominal pain and hematuria.  This been going on for about 3 days.  She took some Azo yesterday and thinks that maybe its gotten better.  No fevers no flank pain.   Abdominal Pain Associated symptoms: hematuria   Hematuria Associated symptoms include abdominal pain.       Home Medications Prior to Admission medications   Medication Sig Start Date End Date Taking? Authorizing Provider  cephALEXin (KEFLEX) 500 MG capsule Take 2 capsules (1,000 mg total) by mouth 2 (two) times daily for 7 days. 04/04/23 04/11/23 Yes Melene Plan, DO  albuterol (ACCUNEB) 1.25 MG/3ML nebulizer solution Take 1 ampule by nebulization every 6 (six) hours as needed for wheezing or shortness of breath.  09/09/16   [provider]  dicyclomine (BENTYL) 20 MG tablet Take 1 tablet (20 mg total) by mouth 2 (two) times daily. 10/15/16   Demetrios Loll T, PA-C  lidocaine (XYLOCAINE) 2 % solution Use as directed 15 mLs in the mouth or throat every 4 (four) hours as needed for mouth pain. 05/24/20   Rushie Chestnut, PA-C  Magnesium 500 MG CAPS Take 1 capsule by mouth daily.    [provider]  omeprazole (PRILOSEC) 20 MG capsule Take 1 capsule (20 mg total) by mouth daily. 10/15/16   Demetrios Loll T, PA-C  ondansetron (ZOFRAN-ODT) 8 MG disintegrating tablet Take 1 tablet (8 mg total) by mouth every 8 (eight) hours as needed for nausea. 10/15/16   Leaphart, Lynann Beaver, PA-C  VENTOLIN HFA 108 (90 Base) MCG/ACT inhaler Inhale 2 puffs into the lungs every 6 (six) hours as needed for wheezing or shortness of breath.  09/09/16   [provider]  vitamin E 1000 UNIT capsule Take 1,000 Units by mouth daily.     [provider]      Allergies    Patient has no known allergies.    Review of Systems   Review of Systems  Gastrointestinal:  Positive for abdominal pain.  Genitourinary:  Positive for hematuria.    Physical Exam Updated Vital Signs BP (!) 150/99   Pulse 73   Temp 98.4 F (36.9 C) (Oral)   Resp 18   Ht 5\' 10"  (1.778 m)   Wt 77.1 kg   SpO2 100%   BMI 24.39 kg/m  Physical Exam Vitals and nursing note reviewed.  Constitutional:      General: She is not in acute distress.    Appearance: She is well-developed. She is not diaphoretic.  HENT:     Head: Normocephalic and atraumatic.  Eyes:     Pupils: Pupils are equal, round, and reactive to light.  Cardiovascular:     Rate and Rhythm: Normal rate and regular rhythm.     Heart sounds: No murmur heard.    No friction rub. No gallop.  Pulmonary:     Effort: Pulmonary effort is normal.     Breath sounds: No wheezing or rales.  Abdominal:     General: There is no distension.     Palpations: Abdomen is soft.     Tenderness: There is no abdominal tenderness. There is no right CVA tenderness or left CVA tenderness.  Comments: Benign abdominal exam  Musculoskeletal:        General: No tenderness.     Cervical back: Normal range of motion and neck supple.  Skin:    General: Skin is warm and dry.  Neurological:     Mental Status: She is alert and oriented to person, place, and time.  Psychiatric:        Behavior: Behavior normal.     ED Results / Procedures / Treatments   Labs (all labs ordered are listed, but only abnormal results are displayed) Labs Reviewed  LIPASE, BLOOD - Abnormal; Notable for the following components:      Result Value   Lipase 10 (*)    All other components within normal limits  COMPREHENSIVE METABOLIC PANEL - Abnormal; Notable for the following components:   Calcium 10.7 (*)    Alkaline Phosphatase 137 (*)    All other components within normal limits  CBC - Abnormal; Notable for  the following components:   RBC 5.55 (*)    Hemoglobin 15.2 (*)    HCT 47.1 (*)    All other components within normal limits  URINALYSIS, ROUTINE W REFLEX MICROSCOPIC - Abnormal; Notable for the following components:   Color, Urine ORANGE (*)    APPearance HAZY (*)    Hgb urine dipstick LARGE (*)    Protein, ur TRACE (*)    Bacteria, UA RARE (*)    All other components within normal limits    EKG None  Radiology CT Renal Stone Study Result Date: 04/04/2023 CLINICAL DATA:  Hematuria. EXAM: CT ABDOMEN AND PELVIS WITHOUT CONTRAST TECHNIQUE: Multidetector CT imaging of the abdomen and pelvis was performed following the standard protocol without IV contrast. RADIATION DOSE REDUCTION: This exam was performed according to the departmental dose-optimization program which includes automated exposure control, adjustment of the mA and/or kV according to patient size and/or use of iterative reconstruction technique. COMPARISON:  10/15/2016 FINDINGS: Lower chest: Unremarkable. Hepatobiliary: No suspicious focal abnormality in the liver on this study without intravenous contrast. There is no evidence for gallstones, gallbladder wall thickening, or pericholecystic fluid. No intrahepatic or extrahepatic biliary dilation. Pancreas: No focal mass lesion. No dilatation of the main duct. No intraparenchymal cyst. No peripancreatic edema. Spleen: No splenomegaly. No suspicious focal mass lesion. Adrenals/Urinary Tract: No adrenal nodule or mass. Right kidney and ureter unremarkable. Marked hydronephrosis noted left kidney, similar to prior without associated hydroureter. No left ureteral stone. Multiple large left-sided renal stones evident including a dominant 20 x 16 mm stone in the lower pole on image 36/2. The stones are new in the interval. No evidence for bladder stones. Stomach/Bowel: Stomach is unremarkable. No gastric wall thickening. No evidence of outlet obstruction. Duodenum is normally positioned as is  the ligament of Treitz. No small bowel wall thickening. No small bowel dilatation. The terminal ileum is normal. The appendix is normal. No gross colonic mass. No colonic wall thickening. Vascular/Lymphatic: There is mild atherosclerotic calcification of the abdominal aorta without aneurysm. There is no gastrohepatic or hepatoduodenal ligament lymphadenopathy. No retroperitoneal or mesenteric lymphadenopathy. No pelvic sidewall lymphadenopathy. Reproductive: Unremarkable. Other: No intraperitoneal free fluid. Musculoskeletal: No worrisome lytic or sclerotic osseous abnormality. IMPRESSION: 1. Marked hydronephrosis left kidney, similar to prior without associated hydroureter. No left ureteral stone. 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 2. Multiple large left-sided renal stones including a dominant 20 x 16 mm stone in the lower pole. The stones are new in the interval. 3. No other acute findings  in the abdomen or pelvis. 4.  Aortic Atherosclerosis (ICD10-I70.0). Electronically Signed   By: Kennith Center M.D.   On: 04/04/2023 10:35    Procedures Procedures    Medications Ordered in ED Medications - No data to display  ED Course/ Medical Decision Making/ A&P                                 Medical Decision Making Amount and/or Complexity of Data Reviewed Labs: ordered. Radiology: ordered.  Risk Prescription drug management.   62 yo F with a chief complaints of suprapubic abdominal discomfort and hematuria.  Going on for about 3 days.  I think most likely the patient has cystitis.  Will obtain a UA.  CT stone study.  Reassess.  CT stone study with what looks like chronic left-sided hydronephrosis.  She has some stones inside the left kidney but no obvious ureterolithiasis.  UA without obvious infection.  Is positive for blood. Will do a trial of oral antibiotics.  Given information to follow-up with urology.  11:11 AM:  I have discussed the diagnosis/risks/treatment options with  the patient.  Evaluation and diagnostic testing in the emergency department does not suggest an emergent condition requiring admission or immediate intervention beyond what has been performed at this time.  They will follow up with Urology. We also discussed returning to the ED immediately if new or worsening sx occur. We discussed the sx which are most concerning (e.g., sudden worsening pain, fever, inability to tolerate by mouth) that necessitate immediate return. Medications administered to the patient during their visit and any new prescriptions provided to the patient are listed below.  Medications given during this visit Medications - No data to display   The patient appears reasonably screen and/or stabilized for discharge and I doubt any other medical condition or other [facility] requiring further screening, evaluation, or treatment in the ED at this time prior to discharge.           Final Clinical Impression(s) / ED Diagnoses Final diagnoses:  Gross hematuria    Rx / DC Orders ED Discharge Orders          Ordered    cephALEXin (KEFLEX) 500 MG capsule  2 times daily        04/04/23 1047              Melene Plan, [place] 04/04/23 1111

## 2023-04-04 NOTE — ED Triage Notes (Signed)
Pt via pov from home with hematuria that began on Saturday and abdominal pain and bloating that began on Saturday. Pt reports no painful urination, states she has "overactive bladder." Denies n/v/d/fever; last bm today (normal). Pt alert & oriented, nad noted.

## 2023-04-04 NOTE — Discharge Instructions (Signed)
Follow up with the urologist in the office.
